# Patient Record
Sex: Male | Born: 1985 | Marital: Single | Smoking: Current every day smoker
Health system: Southern US, Community
[De-identification: ages and names within clinical notes are randomized; demographics above are authoritative.]

## PROBLEM LIST (undated history)

## (undated) DIAGNOSIS — R945 Abnormal results of liver function studies: Secondary | ICD-10-CM

## (undated) HISTORY — DX: Abnormal results of liver function studies: R94.5

---

## 2014-01-26 ENCOUNTER — Ambulatory Visit
Admission: RE | Admit: 2014-01-26 | Discharge: 2014-01-26 | Disposition: A | Discharge status: Home / Self Care | Payer: No Typology Code available for payment source | Source: Ambulatory Visit | Attending: Infectious Disease | Admitting: Infectious Disease

## 2014-01-26 ENCOUNTER — Other Ambulatory Visit: Payer: Self-pay | Admitting: Infectious Disease

## 2014-01-26 DIAGNOSIS — R7611 Nonspecific reaction to tuberculin skin test without active tuberculosis: Secondary | ICD-10-CM

## 2014-12-19 ENCOUNTER — Encounter (HOSPITAL_COMMUNITY): Payer: Self-pay | Admitting: *Deleted

## 2014-12-19 ENCOUNTER — Emergency Department (HOSPITAL_COMMUNITY): Payer: BLUE CROSS/BLUE SHIELD

## 2014-12-19 ENCOUNTER — Emergency Department (HOSPITAL_COMMUNITY)
Admission: EM | Admit: 2014-12-19 | Discharge: 2014-12-19 | Disposition: A | Discharge status: Home / Self Care | Payer: BLUE CROSS/BLUE SHIELD | Attending: Emergency Medicine | Admitting: Emergency Medicine

## 2014-12-19 DIAGNOSIS — Y9289 Other specified places as the place of occurrence of the external cause: Secondary | ICD-10-CM | POA: Insufficient documentation

## 2014-12-19 DIAGNOSIS — S4991XA Unspecified injury of right shoulder and upper arm, initial encounter: Secondary | ICD-10-CM | POA: Diagnosis present

## 2014-12-19 DIAGNOSIS — S43014A Anterior dislocation of right humerus, initial encounter: Secondary | ICD-10-CM | POA: Diagnosis not present

## 2014-12-19 DIAGNOSIS — Y9389 Activity, other specified: Secondary | ICD-10-CM | POA: Insufficient documentation

## 2014-12-19 DIAGNOSIS — S43006A Unspecified dislocation of unspecified shoulder joint, initial encounter: Secondary | ICD-10-CM

## 2014-12-19 DIAGNOSIS — Z79899 Other long term (current) drug therapy: Secondary | ICD-10-CM | POA: Diagnosis not present

## 2014-12-19 DIAGNOSIS — W108XXA Fall (on) (from) other stairs and steps, initial encounter: Secondary | ICD-10-CM | POA: Insufficient documentation

## 2014-12-19 DIAGNOSIS — Y998 Other external cause status: Secondary | ICD-10-CM | POA: Diagnosis not present

## 2014-12-19 DIAGNOSIS — Z720 Tobacco use: Secondary | ICD-10-CM | POA: Insufficient documentation

## 2014-12-19 MED ORDER — OXYCODONE-ACETAMINOPHEN 5-325 MG PO TABS
1.0000 | ORAL_TABLET | Freq: Once | ORAL | Status: AC
  Administered 2014-12-19: 1 via ORAL
  Filled 2014-12-19: qty 1

## 2014-12-19 MED ORDER — ONDANSETRON 4 MG PO TBDP
8.0000 mg | ORAL_TABLET | Freq: Once | ORAL | Status: AC
  Administered 2014-12-19: 8 mg via ORAL
  Filled 2014-12-19: qty 2

## 2014-12-19 NOTE — Discharge Instructions (Signed)
Keep shoulder immobilizer in place until Orthopedic follow up. Call tomorrow to schedule an appointment. Refer to attached documents for more information.

## 2014-12-19 NOTE — ED Notes (Signed)
thge pt is c/o rt shoulder deformity.  He fell down steps today landing   On his shoulder..  He has had rt shoulder surgery for previous dislocatiions

## 2014-12-19 NOTE — ED Provider Notes (Signed)
CSN: [id number]     Arrival date & time 12/19/14  1910 History   First MD Initiated Contact with Patient 12/19/14 2003     Chief Complaint  Patient presents with  . Shoulder Injury     (Consider location/radiation/quality/duration/timing/severity/associated sxs/prior Treatment) Patient is a 29 y.o. male presenting with shoulder pain. The history is provided by the patient. No language interpreter was used.  Shoulder Pain Location:  Shoulder Time since incident:  2 hours Injury: yes   Mechanism of injury: fall   Fall:    Fall occurred:  Down stairs   Height of fall:  6 steps   Impact surface:  Hard floor   Point of impact:  Outstretched arms   Entrapped after fall: no   Shoulder location:  R shoulder Pain details:    Quality:  Aching   Radiates to:  Does not radiate   Severity:  Severe   Onset quality:  Sudden   Duration:  2 hours   Timing:  Constant   Progression:  Unchanged Chronicity:  New Handedness:  Right-handed Dislocation: yes   Foreign body present:  No foreign bodies Tetanus status:  Unknown Prior injury to area:  Yes Relieved by:  Nothing Worsened by:  Nothing tried Ineffective treatments:  None tried Associated symptoms: decreased range of motion   Associated symptoms: no back pain, no fatigue, no neck pain, no stiffness and no swelling   Risk factors: no concern for non-accidental trauma, no known bone disorder, no frequent fractures and no recent illness     History reviewed. No pertinent past medical history. History reviewed. No pertinent past surgical history. No family history on file. History  Substance Use Topics  . Smoking status: Current Every Day Smoker  . Smokeless tobacco: Not on file  . Alcohol Use: Yes    Review of Systems  Constitutional: Negative for fatigue.  Musculoskeletal: Positive for arthralgias. Negative for back pain, stiffness and neck pain.  All other systems reviewed and are negative.     Allergies  Review of  patient's allergies indicates no known allergies.  Home Medications   Prior to Admission medications   Medication Sig Start Date End Date Taking? Authorizing Provider  Calcium Acetate, Phos Binder, (CALCIUM ACETATE PO) Take 1 tablet by mouth daily.   Yes Historical Provider, MD  ibuprofen (ADVIL,MOTRIN) 200 MG tablet Take 200 mg by mouth daily as needed for moderate pain.   Yes Historical Provider, MD  Menthol, Topical Analgesic, 5 % PADS Apply 1 application topically daily as needed (for pain).   Yes Historical Provider, MD   BP 113/78 mmHg  Pulse 83  Temp(Src) 98 F (36.7 C) (Oral)  Resp 16  SpO2 100% Physical Exam  Constitutional: He is oriented to person, place, and time. He appears well-developed and well-nourished. No distress.  HENT:  Head: Normocephalic and atraumatic.  Eyes: Conjunctivae and EOM are normal.  Neck: Normal range of motion.  Cardiovascular: Normal rate, regular rhythm and intact distal pulses.  Exam reveals no gallop and no friction rub.   No murmur heard. Pulmonary/Chest: Effort normal and breath sounds normal. He has no wheezes. He has no rales. He exhibits no tenderness.  Abdominal: Soft. He exhibits no distension. There is no tenderness. There is no rebound.  Musculoskeletal:  Limited ROM of right shoulder due to obvious deformity.   Neurological: He is alert and oriented to person, place, and time. Coordination normal.  Speech is goal-oriented. Moves limbs without ataxia.   Skin: Skin is  warm and dry.  Psychiatric: He has a normal mood and affect. His behavior is normal.  Nursing note and vitals reviewed.   ED Course  Reduction of dislocation Date/Time: 12/19/2014 8:39 PM Performed by: Emilia Beck Authorized by: Emilia Beck Consent: Verbal consent obtained. Consent given by: patient Patient understanding: patient states understanding of the procedure being performed Patient consent: the patient's understanding of the procedure matches  consent given Procedure consent: procedure consent matches procedure scheduled Patient identity confirmed: verbally with patient Local anesthesia used: no Patient sedated: no Patient tolerance: Patient tolerated the procedure well with no immediate complications   (including critical care time) Labs Review Labs Reviewed - No data to display  Imaging Review Dg Shoulder Right  12/19/2014   CLINICAL DATA:  Larey Seat down steps tonight and injured right shoulder. Previous history of shoulder dislocation.  EXAM: RIGHT SHOULDER - 2+ VIEW  COMPARISON:  None.  FINDINGS: There is a anterior subcoracoid dislocation of the humeral head. No obvious fracture. The [facility] joint is intact. No clavicle fracture. The right lung is clear.  IMPRESSION: Anterior subcoracoid dislocation of the humeral head.   Electronically Signed   By: Rudie Meyer M.D.   On: 12/19/2014 20:07   SPLINT APPLICATION Date/Time: 9:48 PM Authorized by: Emilia Beck Consent: Verbal consent obtained. Risks and benefits: risks, benefits and alternatives were discussed Consent given by: patient Splint applied by: orthopedic technician Location details: right shoulder Splint type: shoulder immobilizer Supplies used: shoulder immobilizer Post-procedure: The splinted body part was neurovascularly unchanged following the procedure. Patient tolerance: Patient tolerated the procedure well with no immediate complications.     EKG Interpretation None      MDM   Final diagnoses:  Shoulder dislocation    8:38 PM Xray shows dislocation. Patient was able to tolerate reduction without sedation. Post reduction film pending. Vitals stable and patient afebrile.   9:47 PM Second film shows reduction of dislocation. Patient reports relief of his pain. Patient will have shoulder immobilizer and orthopedic follow up.   Emilia Beck, PA-C 12/19/14 2154  Raeford Razor, MD 12/26/14 1048

## 2014-12-19 NOTE — ED Notes (Signed)
Pt in radiology. Radiology transport to transport pt to room when finished.

## 2016-12-31 IMAGING — CR DG SHOULDER 2+V*R*
2 series · 2 of 2 positions shown · non-contrast
Comparison: December 19, 2014 study obtained earlier in the day

CLINICAL DATA: Postreduction from anterior shoulder dislocation

EXAM:
RIGHT SHOULDER - 2+ VIEW

[shoulder grashey]
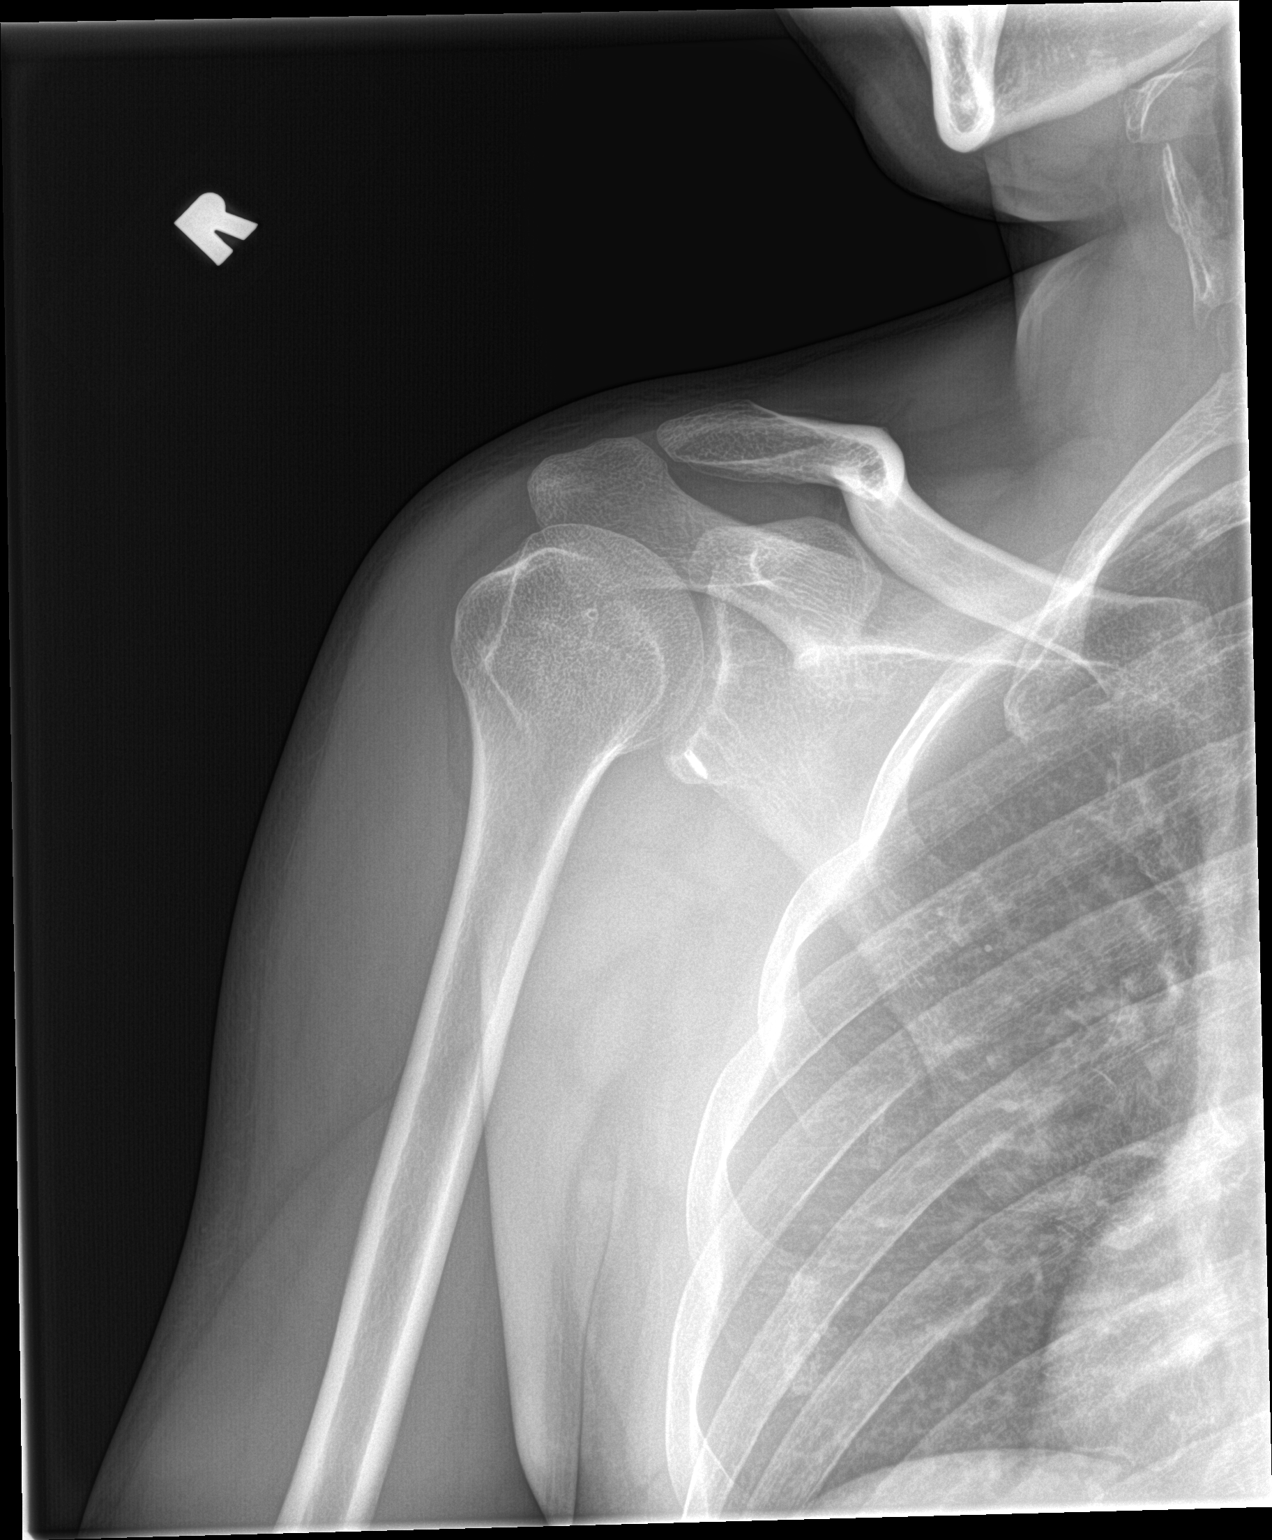

[shoulder y view]
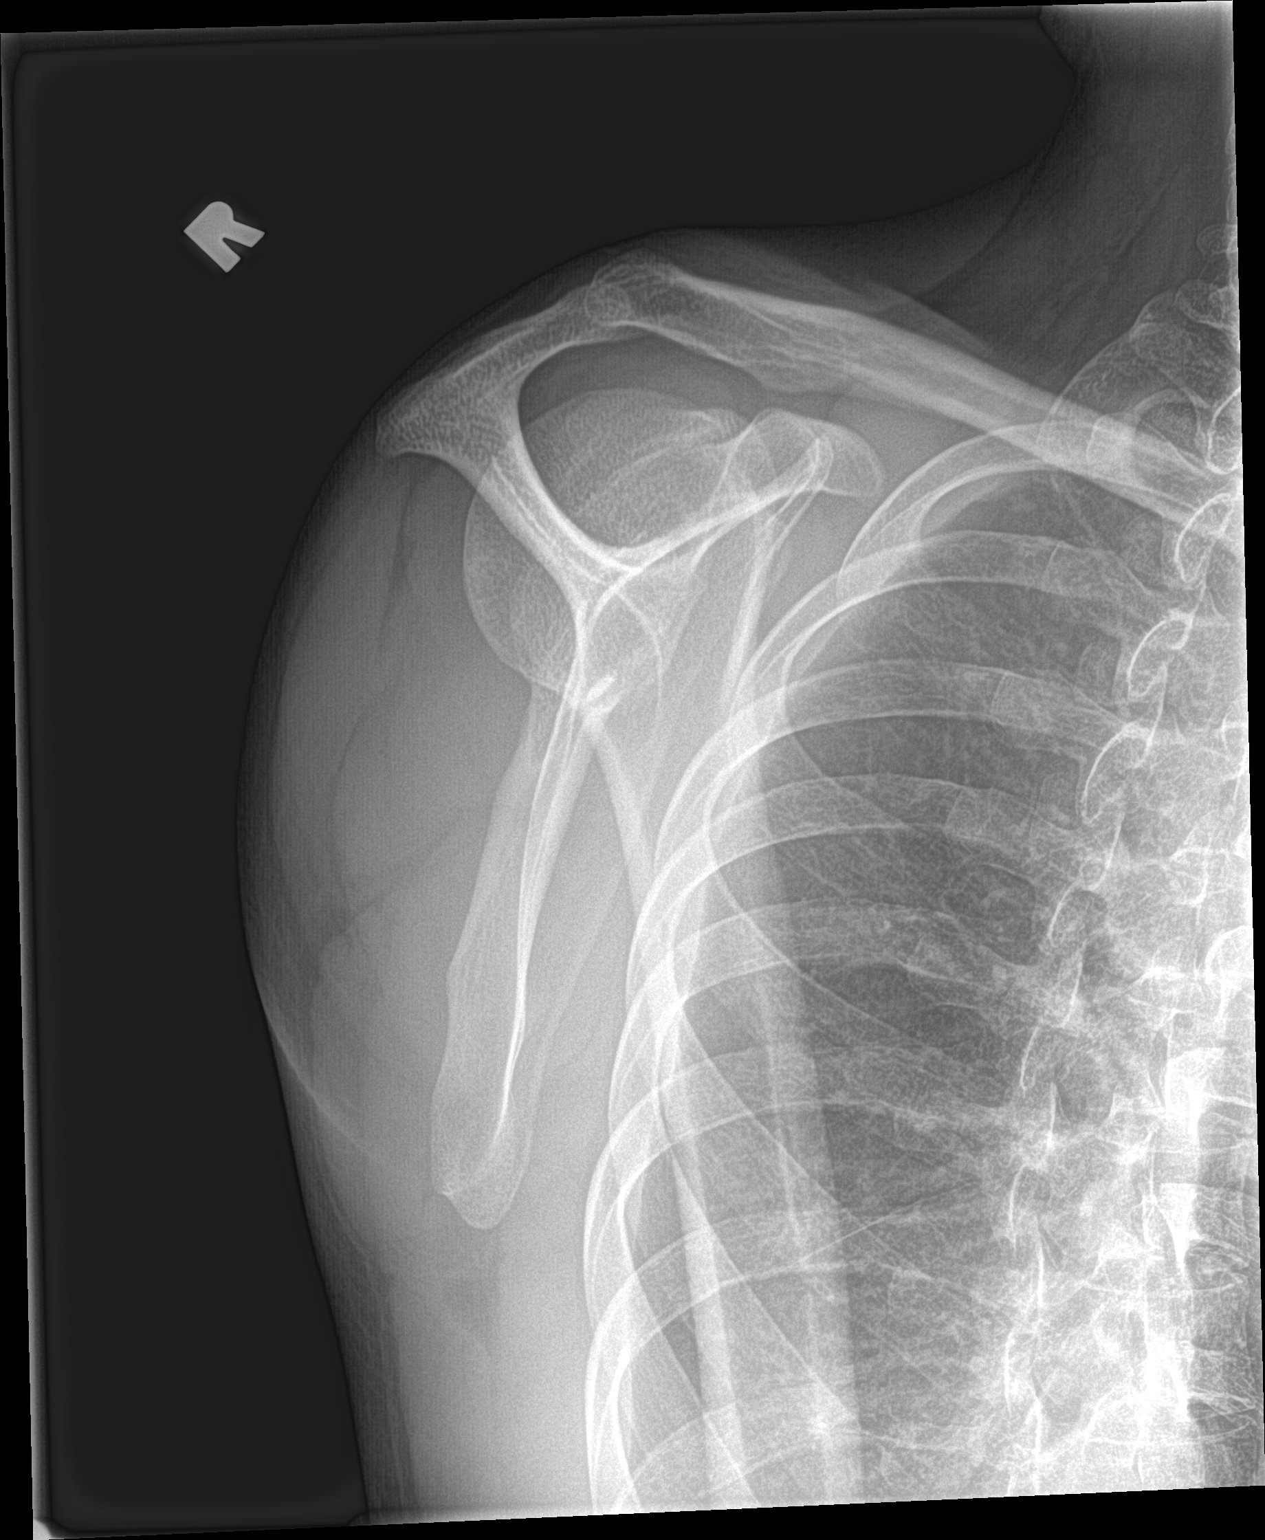

[2 of 2 positions shown; findings below may reference images not displayed]

FINDINGS: Frontal and Y scapular images obtained. The previously noted
subcoracoid anterior dislocation has been reduced successfully.
Currently no fracture or dislocation. A small radiopaque foreign
body is noted in the inferior glenoid. There is no appreciable
arthropathic change.
IMPRESSION: No acute fracture or dislocation noted currently. Successful
interval reduction of subcoracoid anterior dislocation. Small
radiopaque foreign body located in inferior glenoid region.

## 2017-06-25 DIAGNOSIS — R7989 Other specified abnormal findings of blood chemistry: Secondary | ICD-10-CM

## 2017-06-25 DIAGNOSIS — R945 Abnormal results of liver function studies: Secondary | ICD-10-CM

## 2017-06-25 HISTORY — DX: Other specified abnormal findings of blood chemistry: R79.89

## 2017-06-25 HISTORY — DX: Abnormal results of liver function studies: R94.5

## 2017-07-02 ENCOUNTER — Encounter: Payer: Self-pay | Admitting: Gastroenterology

## 2017-07-11 ENCOUNTER — Ambulatory Visit: Payer: BLUE CROSS/BLUE SHIELD | Admitting: Gastroenterology

## 2017-12-24 ENCOUNTER — Encounter: Payer: Self-pay | Admitting: *Deleted
